# Patient Record
Sex: Female | Born: 2000 | Race: Black or African American | Hispanic: No | Marital: Single
Health system: Southern US, Community
[De-identification: ages and names within clinical notes are randomized; demographics above are authoritative.]

---

## 2020-07-10 ENCOUNTER — Emergency Department (HOSPITAL_COMMUNITY): Payer: Medicaid Other

## 2020-07-10 ENCOUNTER — Other Ambulatory Visit: Payer: Self-pay

## 2020-07-10 ENCOUNTER — Emergency Department (HOSPITAL_COMMUNITY)
Admission: EM | Admit: 2020-07-10 | Discharge: 2020-07-10 | Disposition: A | Discharge status: Home / Self Care | Payer: Medicaid Other | Attending: Emergency Medicine | Admitting: Emergency Medicine

## 2020-07-10 DIAGNOSIS — M25561 Pain in right knee: Secondary | ICD-10-CM | POA: Diagnosis present

## 2020-07-10 DIAGNOSIS — Y92410 Unspecified street and highway as the place of occurrence of the external cause: Secondary | ICD-10-CM | POA: Insufficient documentation

## 2020-07-10 DIAGNOSIS — R519 Headache, unspecified: Secondary | ICD-10-CM | POA: Insufficient documentation

## 2020-07-10 DIAGNOSIS — M545 Low back pain, unspecified: Secondary | ICD-10-CM | POA: Diagnosis not present
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
## 2020-07-10 NOTE — ED Notes (Signed)
Patient decided to leave.   
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
